# Patient Record
Sex: Male | Born: 2000 | Race: White | Hispanic: No | Marital: Single | Smoking: Never smoker
Health system: Southern US, Community
[De-identification: ages and names within clinical notes are randomized; demographics above are authoritative.]

## PROBLEM LIST (undated history)

## (undated) DIAGNOSIS — F909 Attention-deficit hyperactivity disorder, unspecified type: Secondary | ICD-10-CM

## (undated) DIAGNOSIS — A491 Streptococcal infection, unspecified site: Secondary | ICD-10-CM

## (undated) DIAGNOSIS — F32A Depression, unspecified: Secondary | ICD-10-CM

## (undated) DIAGNOSIS — F329 Major depressive disorder, single episode, unspecified: Secondary | ICD-10-CM

## (undated) HISTORY — DX: Depression, unspecified: F32.A

## (undated) HISTORY — DX: Attention-deficit hyperactivity disorder, unspecified type: F90.9

## (undated) HISTORY — DX: Major depressive disorder, single episode, unspecified: F32.9

## (undated) HISTORY — DX: Streptococcal infection, unspecified site: A49.1

---
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
## 2020-01-06 ENCOUNTER — Encounter: Payer: Self-pay | Admitting: Family Medicine

## 2020-01-07 ENCOUNTER — Encounter: Payer: Self-pay | Admitting: Family Medicine

## 2020-03-03 DIAGNOSIS — F411 Generalized anxiety disorder: Secondary | ICD-10-CM | POA: Diagnosis not present

## 2020-03-03 DIAGNOSIS — F410 Panic disorder [episodic paroxysmal anxiety] without agoraphobia: Secondary | ICD-10-CM | POA: Diagnosis not present

## 2020-03-03 DIAGNOSIS — F17200 Nicotine dependence, unspecified, uncomplicated: Secondary | ICD-10-CM | POA: Diagnosis not present

## 2020-05-26 DIAGNOSIS — F17200 Nicotine dependence, unspecified, uncomplicated: Secondary | ICD-10-CM | POA: Diagnosis not present

## 2020-05-26 DIAGNOSIS — F410 Panic disorder [episodic paroxysmal anxiety] without agoraphobia: Secondary | ICD-10-CM | POA: Diagnosis not present

## 2020-05-26 DIAGNOSIS — F411 Generalized anxiety disorder: Secondary | ICD-10-CM | POA: Diagnosis not present

## 2020-08-11 DIAGNOSIS — F410 Panic disorder [episodic paroxysmal anxiety] without agoraphobia: Secondary | ICD-10-CM | POA: Diagnosis not present

## 2020-08-11 DIAGNOSIS — F17200 Nicotine dependence, unspecified, uncomplicated: Secondary | ICD-10-CM | POA: Diagnosis not present

## 2020-08-11 DIAGNOSIS — F411 Generalized anxiety disorder: Secondary | ICD-10-CM | POA: Diagnosis not present
